# Patient Record
Sex: Female | Born: 1960 | Race: White | Hispanic: No | Marital: Married | Smoking: Never smoker
Health system: Southern US, Community
[De-identification: ages and names within clinical notes are randomized; demographics above are authoritative.]

## PROBLEM LIST (undated history)

## (undated) DIAGNOSIS — Z8619 Personal history of other infectious and parasitic diseases: Secondary | ICD-10-CM

## (undated) DIAGNOSIS — K921 Melena: Secondary | ICD-10-CM

## (undated) DIAGNOSIS — N390 Urinary tract infection, site not specified: Secondary | ICD-10-CM

## (undated) DIAGNOSIS — K635 Polyp of colon: Secondary | ICD-10-CM

## (undated) HISTORY — DX: Melena: K92.1

## (undated) HISTORY — DX: Polyp of colon: K63.5

## (undated) HISTORY — DX: Urinary tract infection, site not specified: N39.0

## (undated) HISTORY — DX: Personal history of other infectious and parasitic diseases: Z86.19

---

## 1981-06-11 HISTORY — PX: OTHER SURGICAL HISTORY: SHX169

## 1990-06-11 HISTORY — PX: DILATION AND CURETTAGE OF UTERUS: SHX78

## 2012-04-06 ENCOUNTER — Encounter (HOSPITAL_BASED_OUTPATIENT_CLINIC_OR_DEPARTMENT_OTHER): Payer: Self-pay

## 2012-04-06 ENCOUNTER — Emergency Department (HOSPITAL_BASED_OUTPATIENT_CLINIC_OR_DEPARTMENT_OTHER)
Admission: EM | Admit: 2012-04-06 | Discharge: 2012-04-07 | Disposition: A | Discharge status: Home / Self Care | Payer: Self-pay | Attending: Emergency Medicine | Admitting: Emergency Medicine

## 2012-04-06 ENCOUNTER — Emergency Department (HOSPITAL_BASED_OUTPATIENT_CLINIC_OR_DEPARTMENT_OTHER): Payer: Self-pay

## 2012-04-06 DIAGNOSIS — M25569 Pain in unspecified knee: Secondary | ICD-10-CM | POA: Insufficient documentation

## 2012-04-06 DIAGNOSIS — M25561 Pain in right knee: Secondary | ICD-10-CM

## 2012-04-06 DIAGNOSIS — Y9301 Activity, walking, marching and hiking: Secondary | ICD-10-CM | POA: Insufficient documentation

## 2012-04-06 DIAGNOSIS — S76119A Strain of unspecified quadriceps muscle, fascia and tendon, initial encounter: Secondary | ICD-10-CM

## 2012-04-06 DIAGNOSIS — Y9289 Other specified places as the place of occurrence of the external cause: Secondary | ICD-10-CM | POA: Insufficient documentation

## 2012-04-06 DIAGNOSIS — S838X9A Sprain of other specified parts of unspecified knee, initial encounter: Secondary | ICD-10-CM | POA: Insufficient documentation

## 2012-04-06 DIAGNOSIS — W010XXA Fall on same level from slipping, tripping and stumbling without subsequent striking against object, initial encounter: Secondary | ICD-10-CM | POA: Insufficient documentation

## 2012-04-06 MED ORDER — HYDROCODONE-ACETAMINOPHEN 5-325 MG PO TABS: 1.0000 | ORAL_TABLET | Freq: Four times a day (QID) | ORAL | Status: DC | PRN

## 2012-04-06 MED ORDER — HYDROCODONE-ACETAMINOPHEN 5-325 MG PO TABS
1.0000 | ORAL_TABLET | Freq: Once | ORAL | Status: AC
  Administered 2012-04-06: 1 via ORAL
  Filled 2012-04-06: qty 1

## 2012-04-06 NOTE — ED Provider Notes (Signed)
History   This chart was scribed for Beth Seamen, MD by Beth Schwartz. The patient was seen in room MH05/MH05 and the patient's care was started at 10:54PM.     CSN: [id number]  Arrival date & time 04/06/12  2057   First MD Initiated Contact with Patient 04/06/12 2254      Chief Complaint  Patient presents with  . Fall    (Consider location/radiation/quality/duration/timing/severity/associated sxs/prior treatment) Patient is a 51 y.o. female presenting with fall. The history is provided by the patient. No language interpreter was used.  Fall The accident occurred yesterday. The fall occurred while walking. She fell from an unknown height. She landed on a hard floor. There was no blood loss. The point of impact was the right knee. The pain is present in the right knee. She was ambulatory at the scene. There was no entrapment after the fall. There was no drug use involved in the accident. Pertinent negatives include no loss of consciousness. The symptoms are aggravated by standing, use of the injured limb and ambulation. She has tried ice for the symptoms. The treatment provided no relief.    Beth Schwartz is a 51 y.o. female who presents to the Emergency Department complaining of   sudden, progressively worsening knee pain located at the right knee, onset yesterday (04/05/12)  Associated symptoms include neck pain and sweats.  The pt reports that she fell yesterday morning while on a ladies retreat with her church group. The pt was en route to the bathroom, where suddenly lost her footing, and impacted upon her right side. The pt has been taking ibuprofen which does not provide relief of the knee pain and applying ice to the right knee which does not provide pain relief. Modifying factors include walking and certain movements and positions of the right knee which intensify the knee pain.  The pt denies any LOC associated with the fall episode.    The pt does not smoke or drink alcohol.       History reviewed. No pertinent past medical history.  History reviewed. No pertinent past surgical history.  No family history on file.  History  Substance Use Topics  . Smoking status: Never Smoker   . Smokeless tobacco: Not on file  . Alcohol Use:     OB History    Grav Para Term Preterm Abortions TAB SAB Ect Mult Living                  Review of Systems  Neurological: Negative for loss of consciousness.  All other systems reviewed and are negative.    Allergies  Review of patient's allergies indicates no known allergies.  Home Medications   Current Outpatient Rx  Name Route Sig Dispense Refill  . IBUPROFEN 200 MG PO TABS Oral Take 200 mg by mouth every 6 (six) hours as needed.      BP 102/74  Pulse 84  Temp 98 F (36.7 C) (Oral)  Resp 16  SpO2 100%  Physical Exam  Nursing note and vitals reviewed. Constitutional: She is oriented to person, place, and time. She appears well-developed and well-nourished.  HENT:  Head: Atraumatic.  Nose: Nose normal.       Ecchymosis at right corner of mouth.   Eyes: Conjunctivae normal and EOM are normal. Pupils are equal, round, and reactive to light.  Neck: Normal range of motion. Neck supple.  Cardiovascular: Normal rate, regular rhythm and normal heart sounds.   Pulmonary/Chest: Effort normal and breath  sounds normal.  Abdominal: Soft. Bowel sounds are normal.  Musculoskeletal: Normal range of motion. She exhibits tenderness.       Generally tender with insertion of the right quadriceps.   Neurological: She is alert and oriented to person, place, and time.  Skin: Skin is warm and dry.  Psychiatric: She has a normal mood and affect. Her behavior is normal.    ED Course  Procedures (including critical care time)  DIAGNOSTIC STUDIES: Oxygen Saturation is 100% on room air, normal by my interpretation.    COORDINATION OF CARE:  11:01 PM- Treatment plan concerning x-ray of right knee and pain management  discussed with patient. Pt agrees with treatment.      MDM  Nursing notes and vitals signs, including pulse oximetry, reviewed.  Summary of this visit's results, reviewed by myself:  Imaging Studies: Dg Knee Complete 4 Views Right  04/06/2012  *RADIOLOGY REPORT*  Clinical Data: Fall 1 day ago.  Pain.  RIGHT KNEE - COMPLETE 4+ VIEW  Comparison: None.  Findings: There is no acute bony or joint abnormality.  There is some degenerative change about the knee most notable in the patellofemoral compartment.  There is no joint effusion.  IMPRESSION:  1.  No acute finding. 2.  Patellofemoral degenerative change.   Original Report Authenticated By: Beth Schwartz. Beth Schwartz, M.D.          I personally performed the services described in this documentation, which was scribed in my presence.  The recorded information has been reviewed and considered.     Beth Seamen, MD 04/06/12 [phone]

## 2012-04-06 NOTE — ED Notes (Signed)
Patient here after reporting that she stood up to go to bathroom and her legs gave way, she fell to ground landing on right side. Reports that her pain is worse to right knee and pain with any ambulation-iced and used ibuprofen w/o relief. Also complains of right sided facial pain and right breast pain, no loc

## 2012-04-06 NOTE — ED Notes (Signed)
MD at bedside. 

## 2012-04-06 NOTE — ED Notes (Signed)
Returned from xray

## 2012-04-06 NOTE — ED Notes (Signed)
Transported to xray 

## 2012-04-07 MED ORDER — ONDANSETRON 4 MG PO TBDP
4.0000 mg | ORAL_TABLET | Freq: Once | ORAL | Status: AC
  Administered 2012-04-07: 4 mg via ORAL
  Filled 2012-04-07: qty 1

## 2012-04-07 NOTE — ED Notes (Signed)
At d/c home pt. States she feels nauseated and like she is going to "pass out" MD updated and pt. Medicated per order. Vitals stable. Cool cloth applied to her head. Will monitor.

## 2012-04-07 NOTE — ED Notes (Signed)
Pt. States she feels "a little" better. Ginger Ale and crackers given. VSS.

## 2012-04-07 NOTE — ED Notes (Signed)
Patient stated she was feeling sick as I helped her into wheel chair. Patient was not able to help get herself into chair, her husband and I assisted her into chair. Patient stated she felt like falling out. I called nurse who brought patient nausea med. Patient lethargic slouching in wheel chair. Husband at her side.

## 2012-04-07 NOTE — ED Notes (Signed)
VSS remain stable. Pt. States she feels better. Pt. Assisted to car by Mauricio Po, EMT

## 2012-04-07 NOTE — ED Notes (Signed)
I applied knee immobilizer, then helped patient with crutches. Patient felt unable to use crutches so refused to take home. Patient stated she will use her walker once home.

## 2012-04-07 NOTE — ED Notes (Signed)
EMT at bedside to place knee immobilizer 

## 2014-08-04 ENCOUNTER — Ambulatory Visit: Payer: Self-pay | Admitting: Physician Assistant

## 2014-08-04 ENCOUNTER — Ambulatory Visit (INDEPENDENT_AMBULATORY_CARE_PROVIDER_SITE_OTHER): Payer: BLUE CROSS/BLUE SHIELD | Admitting: Physician Assistant

## 2014-08-04 ENCOUNTER — Encounter: Payer: Self-pay | Admitting: Physician Assistant

## 2014-08-04 VITALS — BP 135/71 | HR 69 | Temp 98.2°F | Resp 16 | Ht 61.0 in | Wt 232.2 lb

## 2014-08-04 DIAGNOSIS — K429 Umbilical hernia without obstruction or gangrene: Secondary | ICD-10-CM

## 2014-08-04 DIAGNOSIS — M2560 Stiffness of unspecified joint, not elsewhere classified: Secondary | ICD-10-CM

## 2014-08-04 DIAGNOSIS — I872 Venous insufficiency (chronic) (peripheral): Secondary | ICD-10-CM

## 2014-08-04 LAB — CBC
HCT: 40.4 % (ref 36.0–46.0)
Hemoglobin: 13.5 g/dL (ref 12.0–15.0)
MCHC: 33.4 g/dL (ref 30.0–36.0)
MCV: 84.1 fl (ref 78.0–100.0)
Platelets: 215 10*3/uL (ref 150.0–400.0)
RBC: 4.81 Mil/uL (ref 3.87–5.11)
RDW: 13.1 % (ref 11.5–15.5)
WBC: 9.2 10*3/uL (ref 4.0–10.5)

## 2014-08-04 LAB — C-REACTIVE PROTEIN: CRP: 2.2 mg/dL (ref 0.5–20.0)

## 2014-08-04 MED ORDER — MELOXICAM 15 MG PO TABS: 15.0000 mg | ORAL_TABLET | Freq: Every day | ORAL | Status: DC

## 2014-08-04 NOTE — Patient Instructions (Signed)
Please go to the lab for blood work.  You will be called to schedule an [place] of your legs.  I will call you with your results.  Stay active.  Take Mobic as directed if pain is moderate.  Ibuprofen or extra-strength tylenol for mild pain.  For the hernia, stay well hydrated.  You will be contacted to schedule an appointment with general surgery.  If you notice severe pain at the hernia site, redness, tenderness, inability to pass gas or stool, or fever, please go to the ER.  Please return at your earliest convenience for a complete physical.

## 2014-08-04 NOTE — Progress Notes (Signed)
Pre visit review using our clinic review tool, if applicable. No additional management support is needed unless otherwise documented below in the visit note/SLS  
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
## 2014-08-05 LAB — RHEUMATOID FACTOR

## 2014-08-09 DIAGNOSIS — I872 Venous insufficiency (chronic) (peripheral): Secondary | ICD-10-CM | POA: Insufficient documentation

## 2014-08-09 DIAGNOSIS — K429 Umbilical hernia without obstruction or gangrene: Secondary | ICD-10-CM | POA: Insufficient documentation

## 2014-08-09 DIAGNOSIS — M2560 Stiffness of unspecified joint, not elsewhere classified: Secondary | ICD-10-CM | POA: Insufficient documentation

## 2014-08-09 NOTE — Assessment & Plan Note (Signed)
Unclear etiology.  Will obtain labs to further assess - CBC, hs-CRP, RF.

## 2014-08-09 NOTE — Assessment & Plan Note (Signed)
Intermittent pain.  Palpable on examination.  Will refer to General Surgery for further evaluation.

## 2014-08-09 NOTE — Assessment & Plan Note (Signed)
Will obtain doppler venous reflux to further assess.  Increased ambulation.  Limit salt. Compression stockings.

## 2014-08-11 ENCOUNTER — Inpatient Hospital Stay (HOSPITAL_COMMUNITY)
Admission: RE | Admit: 2014-08-11 | Discharge: 2014-08-11 | Disposition: A | Discharge status: Home / Self Care | Payer: BLUE CROSS/BLUE SHIELD | Source: Ambulatory Visit

## 2014-08-11 DIAGNOSIS — I872 Venous insufficiency (chronic) (peripheral): Secondary | ICD-10-CM

## 2014-08-12 ENCOUNTER — Ambulatory Visit (HOSPITAL_COMMUNITY)
Admission: RE | Admit: 2014-08-12 | Discharge: 2014-08-12 | Disposition: A | Discharge status: Home / Self Care | Payer: BLUE CROSS/BLUE SHIELD | Source: Ambulatory Visit | Attending: Vascular Surgery | Admitting: Vascular Surgery

## 2014-08-12 ENCOUNTER — Other Ambulatory Visit: Payer: Self-pay | Admitting: Physician Assistant

## 2014-08-12 DIAGNOSIS — I872 Venous insufficiency (chronic) (peripheral): Secondary | ICD-10-CM

## 2014-08-31 ENCOUNTER — Other Ambulatory Visit: Payer: Self-pay | Admitting: Physician Assistant

## 2014-09-01 NOTE — Telephone Encounter (Signed)
Med filled.  

## 2015-04-09 ENCOUNTER — Encounter: Payer: Self-pay | Admitting: Family Medicine

## 2015-04-09 ENCOUNTER — Ambulatory Visit (INDEPENDENT_AMBULATORY_CARE_PROVIDER_SITE_OTHER): Payer: BLUE CROSS/BLUE SHIELD | Admitting: Family Medicine

## 2015-04-09 VITALS — BP 112/80 | HR 74 | Temp 97.7°F | Ht 61.0 in | Wt 229.5 lb

## 2015-04-09 DIAGNOSIS — R05 Cough: Secondary | ICD-10-CM | POA: Diagnosis not present

## 2015-04-09 DIAGNOSIS — R059 Cough, unspecified: Secondary | ICD-10-CM

## 2015-04-09 DIAGNOSIS — J0110 Acute frontal sinusitis, unspecified: Secondary | ICD-10-CM | POA: Diagnosis not present

## 2015-04-09 MED ORDER — AZITHROMYCIN 250 MG PO TABS: ORAL_TABLET | ORAL | Status: AC

## 2015-04-09 MED ORDER — BENZONATATE 200 MG PO CAPS: 200.0000 mg | ORAL_CAPSULE | Freq: Three times a day (TID) | ORAL | Status: AC | PRN
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
## 2015-04-09 NOTE — Progress Notes (Signed)
Pre visit review using our clinic review tool, if applicable. No additional management support is needed unless otherwise documented below in the visit note. 

## 2015-04-09 NOTE — Patient Instructions (Signed)

## 2015-04-11 ENCOUNTER — Ambulatory Visit (INDEPENDENT_AMBULATORY_CARE_PROVIDER_SITE_OTHER): Payer: BLUE CROSS/BLUE SHIELD | Admitting: Physician Assistant

## 2015-04-11 ENCOUNTER — Encounter: Payer: Self-pay | Admitting: Physician Assistant

## 2015-04-11 ENCOUNTER — Ambulatory Visit (HOSPITAL_BASED_OUTPATIENT_CLINIC_OR_DEPARTMENT_OTHER)
Admission: RE | Admit: 2015-04-11 | Discharge: 2015-04-11 | Disposition: A | Discharge status: Home / Self Care | Payer: BLUE CROSS/BLUE SHIELD | Source: Ambulatory Visit | Attending: Physician Assistant | Admitting: Physician Assistant

## 2015-04-11 VITALS — BP 139/83 | HR 64 | Temp 98.3°F | Resp 18 | Ht 61.0 in | Wt 227.4 lb

## 2015-04-11 DIAGNOSIS — B9689 Other specified bacterial agents as the cause of diseases classified elsewhere: Secondary | ICD-10-CM

## 2015-04-11 DIAGNOSIS — J00 Acute nasopharyngitis [common cold]: Secondary | ICD-10-CM

## 2015-04-11 DIAGNOSIS — J208 Acute bronchitis due to other specified organisms: Secondary | ICD-10-CM

## 2015-04-11 DIAGNOSIS — M545 Low back pain, unspecified: Secondary | ICD-10-CM | POA: Insufficient documentation

## 2015-04-11 DIAGNOSIS — R05 Cough: Secondary | ICD-10-CM | POA: Diagnosis present

## 2015-04-11 DIAGNOSIS — R0989 Other specified symptoms and signs involving the circulatory and respiratory systems: Secondary | ICD-10-CM | POA: Insufficient documentation

## 2015-04-11 MED ORDER — HYDROCOD POLST-CPM POLST ER 10-8 MG/5ML PO SUER: 5.0000 mL | Freq: Two times a day (BID) | ORAL | Status: DC | PRN

## 2015-04-11 MED ORDER — METHOCARBAMOL 500 MG PO TABS: 500.0000 mg | ORAL_TABLET | Freq: Three times a day (TID) | ORAL | Status: AC

## 2015-04-11 MED ORDER — METHOCARBAMOL 500 MG PO TABS: 500.0000 mg | ORAL_TABLET | Freq: Three times a day (TID) | ORAL | Status: DC

## 2015-04-11 MED ORDER — HYDROCOD POLST-CPM POLST ER 10-8 MG/5ML PO SUER: 5.0000 mL | Freq: Two times a day (BID) | ORAL | Status: AC | PRN

## 2015-04-11 NOTE — Progress Notes (Signed)
Pre visit review using our clinic review tool, if applicable. No additional management support is needed unless otherwise documented below in the visit note/SLS  

## 2015-04-11 NOTE — Patient Instructions (Signed)
Go downstairs for chest x-ray. I will call with results. Take antibiotic (Azithromycin) as directed.  Increase fluids.  Get plenty of rest. Use Mucinex for congestion. Use Tussionex as directed for cough Take a daily probiotic (I recommend Align or Culturelle, but even Activia Yogurt may be beneficial).  A humidifier placed in the bedroom may offer some relief for a dry, scratchy throat of nasal irritation.  Read information below on acute bronchitis. Please call or return to clinic if symptoms are not improving.  Take the muscle relaxant as directed. Limit heavy lifting. Suspect pain will resolve once cough is calmed down. Alternate Ibuprofen and extra-strength tylenol for pain.  Acute Bronchitis Bronchitis is when the airways that extend from the windpipe into the lungs get red, puffy, and painful (inflamed). Bronchitis often causes thick spit (mucus) to develop. This leads to a cough. A cough is the most common symptom of bronchitis. In acute bronchitis, the condition usually begins suddenly and goes away over time (usually in 2 weeks). Smoking, allergies, and asthma can make bronchitis worse. Repeated episodes of bronchitis may cause more lung problems.  HOME CARE  Rest.  Drink enough fluids to keep your pee (urine) clear or pale yellow (unless you need to limit fluids as told by your doctor).  Only take over-the-counter or prescription medicines as told by your doctor.  Avoid smoking and secondhand smoke. These can make bronchitis worse. If you are a smoker, think about using nicotine gum or skin patches. Quitting smoking will help your lungs heal faster.  Reduce the chance of getting bronchitis again by:  Washing your hands often.  Avoiding people with cold symptoms.  Trying not to touch your hands to your mouth, nose, or eyes.  Follow up with your doctor as told.  GET HELP IF: Your symptoms do not improve after 1 week of treatment. Symptoms include:  Cough.  Fever.  Coughing  up thick spit.  Body aches.  Chest congestion.  Chills.  Shortness of breath.  Sore throat.  GET HELP RIGHT AWAY IF:   You have an increased fever.  You have chills.  You have severe shortness of breath.  You have bloody thick spit (sputum).  You throw up (vomit) often.  You lose too much body fluid (dehydration).  You have a severe headache.  You faint.  MAKE SURE YOU:   Understand these instructions.  Will watch your condition.  Will get help right away if you are not doing well or get worse. Document Released: 11/14/2007 Document Revised: 01/28/2013 Document Reviewed: 11/18/2012 [facility] Patient Information 2015 [place], [place]. This information is not intended to replace advice given to you by your health care provider. Make sure you discuss any questions you have with your health care provider.

## 2015-04-11 NOTE — Assessment & Plan Note (Signed)
Alternate tylenol and Ibuprofen. Rx Robaxin. Limit heavy lifting. Heating pad as directed.

## 2015-04-11 NOTE — Assessment & Plan Note (Signed)
Will obtain CXR to r/o pneumonia giving continued symptoms. Continue Azithromycin. Will change if indicated by CXR result. Rx Tussionex syrup to use at night. Increase fluids. Rest. Humidifier in bedroom.

## 2015-04-11 NOTE — Progress Notes (Signed)
Patient presents to clinic today c/o chest congestion, productive cough ad fatigue x > 1 week. Was seen at Saturday clinic and started on a Z-pack and tessalon perles. Endorses chest congestion is improving but cough is constant and is causing lower back pain. Denies chest pain, SOB, recent travel or sick contact. Denies fever or chills.  Past Medical History  Diagnosis Date  . History of chicken pox   . Blood in stool   . Colon polyps   . UTI (lower urinary tract infection)     Current Outpatient Prescriptions on File Prior to Visit  Medication Sig Dispense Refill  . azithromycin (ZITHROMAX Z-PAK) 250 MG tablet Take 2 tablets (500 mg) on  Day 1,  followed by 1 tablet (250 mg) once daily on Days 2 through 5. 6 each 0  . benzonatate (TESSALON) 200 MG capsule Take 1 capsule (200 mg total) by mouth 3 (three) times daily as needed. 30 capsule 0  . ibuprofen (ADVIL,MOTRIN) 200 MG tablet Take 200 mg by mouth every 6 (six) hours as needed.    . Multiple Vitamins-Minerals (AIR[place]ORNE) CHEW Chew by mouth daily.     No current facility-administered medications on file prior to visit.    No Known Allergies  Family History  Problem Relation Age of Onset  . Adopted: Yes  . Arthritis Mother     Living  . Colon cancer Mother   . Hyperlipidemia Mother   . Hypertension Mother   . Stomach cancer Mother   . Ovarian cancer Maternal Grandmother   . Lung cancer Maternal Grandmother   . Heart disease Maternal Aunt   . Emphysema Maternal Aunt   . Brain cancer Maternal Aunt   . Stroke Brother   . Healthy Sister     x1  . Cancer Daughter   . Asthma Maternal Grandmother   . Heart Problems Daughter     Social History   Social History  . Marital Status: Married    Spouse Name: N/A  . Number of Children: N/A  . Years of Education: N/A   Social History Main Topics  . Smoking status: Never Smoker   . Smokeless tobacco: Never Used  . Alcohol Use: No  . Drug Use: No  . Sexual Activity: Not  Asked   Other Topics Concern  . None   Social History Narrative   Review of Systems - See HPI.  All other ROS are negative.  BP 139/83 mmHg  Pulse 64  Temp(Src) 98.3 F (36.8 C) (Oral)  Resp 18  Ht 5\' 1"  (1.549 m)  Wt 227 lb 6 oz (103.137 kg)  BMI 42.98 kg/m2  SpO2 98%  Physical Exam  Constitutional: She is oriented to person, place, and time and well-developed, well-nourished, and in no distress.  HENT:  Head: Normocephalic and atraumatic.  Right Ear: External ear normal.  Left Ear: External ear normal.  Nose: Nose normal.  Mouth/Throat: Oropharynx is clear and moist. No oropharyngeal exudate.  TM within normal limits bilaterally.  Eyes: Conjunctivae are normal.  Neck: Neck supple.  Cardiovascular: Normal rate, regular rhythm, normal heart sounds and intact distal pulses.   Pulmonary/Chest: Effort normal and breath sounds normal. No respiratory distress. She has no wheezes. She has no rales. She exhibits no tenderness.  Musculoskeletal:       Lumbar back: She exhibits tenderness and spasm. She exhibits no bony tenderness.  Neurological: She is alert and oriented to person, place, and time.  Skin: Skin is warm and dry.  No rash noted.  Vitals reviewed.   No results found for this or any previous visit (from the past 2160 hour(s)).  Assessment/Plan: Right-sided low back pain without sciatica Alternate tylenol and Ibuprofen. Rx Robaxin. Limit heavy lifting. Heating pad as directed.  Acute bacterial bronchitis Will obtain CXR to r/o pneumonia giving continued symptoms. Continue Azithromycin. Will change if indicated by CXR result. Rx Tussionex syrup to use at night. Increase fluids. Rest. Humidifier in bedroom.

## 2015-05-18 ENCOUNTER — Telehealth: Payer: Self-pay | Admitting: *Deleted

## 2015-05-18 NOTE — Telephone Encounter (Signed)
"  This is on [place].  If you can call me back.  Thank you."

## 2015-05-19 ENCOUNTER — Telehealth: Payer: Self-pay | Admitting: Physician Assistant

## 2015-05-19 NOTE — Telephone Encounter (Signed)
LM for pt to call and schedule flu shot or update records. °

## 2015-05-20 ENCOUNTER — Ambulatory Visit: Payer: BLUE CROSS/BLUE SHIELD | Admitting: Podiatry

## 2015-05-24 ENCOUNTER — Encounter: Payer: Self-pay | Admitting: Podiatry

## 2015-05-24 ENCOUNTER — Ambulatory Visit: Payer: BLUE CROSS/BLUE SHIELD

## 2015-05-24 ENCOUNTER — Ambulatory Visit (INDEPENDENT_AMBULATORY_CARE_PROVIDER_SITE_OTHER): Payer: BLUE CROSS/BLUE SHIELD | Admitting: Podiatry

## 2015-05-24 DIAGNOSIS — R52 Pain, unspecified: Secondary | ICD-10-CM

## 2015-05-24 DIAGNOSIS — M7989 Other specified soft tissue disorders: Secondary | ICD-10-CM

## 2015-05-24 DIAGNOSIS — M898X9 Other specified disorders of bone, unspecified site: Secondary | ICD-10-CM

## 2015-05-24 DIAGNOSIS — M799 Soft tissue disorder, unspecified: Secondary | ICD-10-CM | POA: Diagnosis not present

## 2015-05-24 NOTE — Progress Notes (Signed)
Subjective:    Patient ID: Beth Schwartz, female    DOB: 08-May-1961, 54 y.o.   MRN: [id number]  HPI  54 year old female presents the office as concerns of soft tissue masses of the scalp therapy as well as the outside portion of her left ankle with a left side worse than the right. His been ongoing for several months.  She previously has had injections into both the areas which seems to help however the pain does recur the injections only last for short period time. She has tried offloading padding without any relief of symptoms. This time she is requesting surgical intervention to help decrease her pain. No other complaints.   Review of Systems  All other systems reviewed and are negative.      Objective:   Physical Exam General: AAO x3, NAD  Dermatological: Skin is warm, dry and supple bilateral. Nails x 10 are well manicured; remaining integument appears unremarkable at this time. There are no open sores, no preulcerative lesions, no rash or signs of infection present.  Vascular: Dorsalis Pedis artery and Posterior Tibial artery pedal pulses are 2/4 bilateral with immedate capillary fill time. Pedal hair growth present. No varicosities and no lower extremity edema present bilateral. There is no pain with calf compression, swelling, warmth, erythema.   Neruologic: Grossly intact via light touch bilateral. Vibratory intact via tuning fork bilateral. Protective threshold with Semmes Wienstein monofilament intact to all pedal sites bilateral. Patellar and Achilles deep tendon reflexes 2+ bilateral. No Babinski or clonus noted bilateral.   Musculoskeletal:  On the dorsal aspect of bilateral midfoot there is a soft tissue fluid-filled well encapsulated mass on the dorsal aspect of the foot with an underlying bony exostosis present. The slight tenderness to palpation on this area. No overlying skin change or erythema. On the lateral aspect left ankle just inferior to the lateral malleolus  is a soft,  Fatty soft tissue mass. 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 Mild tenderness upon this area. No edema, erythema open sores. No other areas of tenderness bilateral lower extremity is. Muscular strength 5/5 in all groups tested bilateral.  Gait: Unassisted, Nonantalgic.      Assessment & Plan:   54 year old female presents the office of bilateral soft tissue mass left greater than right -X-rays were obtained and reviewed with the patient.  -Treatment options discussed including all alternatives, risks, and complications -Etiology of symptoms were discussed - At this time I did discuss an MRI of the ankle and the left side to further by with the soft tissue mass. Should proceed with this. This was ordered today. - She also at the overhead and schedule surgery and had a stent by the end of the year. She states that if the MRI is completed before the surgery she would like to proceed with surgery with of the MRI. I discussed the soft tissue mass excision as well as dorsal exostectomy.  I discussed with her this is not a guarantee resolution of symptoms and there is a high chance of recurrence. -The incision placement as well as the postoperative course was discussed with the patient. I discussed risks of the surgery which include, but not limited to, infection, bleeding, pain, swelling, need for further surgery, delayed or nonhealing, painful or ugly scar, numbness or sensation changes, over/under correction, recurrence, transfer lesions, further deformity, hardware failure, DVT/PE, loss of toe/foot. Patient understands these risks and wishes to proceed with surgery. The surgical consent was reviewed with the patient all 3  pages were signed. No promises or guarantees were given to the outcome of the procedure. All questions were answered to the best of my ability. Before the surgery the patient was encouraged to call the office if there is any further questions. The surgery will be performed at  the [facility] on an outpatient basis.  Celesta Gentile, DPM

## 2015-05-24 NOTE — Patient Instructions (Signed)

## 2015-05-25 ENCOUNTER — Telehealth: Payer: Self-pay | Admitting: *Deleted

## 2015-05-25 DIAGNOSIS — M7989 Other specified soft tissue disorders: Secondary | ICD-10-CM

## 2015-05-25 NOTE — Telephone Encounter (Addendum)
-----   Message from Trula Slade, DPM sent at 05/24/2015  8:26 PM EST ----- She needs an MRi of the left ankle due to soft tissue mass on lateral ankle. She is also having surgery 12/28 I think and I would like this before if possible. Thanks. Pt is from [place], I left message requesting location she would prefer for MRI.  Left message with receptionist at [facility] to have pt call.  05/26/2015 - Asked [facility] if soft tissue mass needed contrast, she said MRI need to be with and without.  Ordered Creatnine Serum and faxed to [facility].  Called pt's mobile [phone] unable to leave message mailbox is full.  Pt called states call on her cellphone and she would keep it on her.  Called unable to leave message mailbox full.  Called pt's mobile phone unable to leave a message.  Called pt's home phone [phone] and the phone rang 1 minute and no answer or voicemail. Unable to leave message on cellphone. 05/27/2015 - Called pt's cell 3 times consecutively, to get the pt to answer, unable to leave message mailbox is full, pt's home phone rings with no answering device. INFORMED PT MRI ORDERED WITH [facility] AND WOULD BEGIN PRE-CERT.  GAVE PT [facility] [phone] TO SCHEDULE.  [place] [id number], VALID 05/27/2015 TO 06/25/2015.  FAXED TO [facility].

## 2015-05-26 NOTE — Telephone Encounter (Signed)
-----   Message from Trula Slade, DPM sent at 05/24/2015  8:26 PM EST ----- She needs an MRi of the left ankle due to soft tissue mass on lateral ankle. She is also having surgery 12/28 I think and I would like this before if possible. thanks

## 2015-06-03 ENCOUNTER — Ambulatory Visit
Admission: RE | Admit: 2015-06-03 | Discharge: 2015-06-03 | Disposition: A | Discharge status: Home / Self Care | Payer: BLUE CROSS/BLUE SHIELD | Source: Ambulatory Visit | Attending: Podiatry | Admitting: Podiatry

## 2015-06-03 DIAGNOSIS — M7989 Other specified soft tissue disorders: Secondary | ICD-10-CM

## 2015-06-03 MED ORDER — GADOBENATE DIMEGLUMINE 529 MG/ML IV SOLN: 20.0000 mL | Freq: Once | INTRAVENOUS | Status: DC | PRN

## 2015-06-08 DIAGNOSIS — D492 Neoplasm of unspecified behavior of bone, soft tissue, and skin: Secondary | ICD-10-CM | POA: Diagnosis not present

## 2015-06-08 DIAGNOSIS — M2570 Osteophyte, unspecified joint: Secondary | ICD-10-CM | POA: Diagnosis not present

## 2015-06-17 ENCOUNTER — Other Ambulatory Visit: Payer: Self-pay

## 2015-06-20 ENCOUNTER — Encounter: Payer: Self-pay | Admitting: Podiatry

## 2015-06-20 ENCOUNTER — Other Ambulatory Visit: Payer: Self-pay | Admitting: Podiatry

## 2015-06-20 ENCOUNTER — Ambulatory Visit (INDEPENDENT_AMBULATORY_CARE_PROVIDER_SITE_OTHER): Payer: BLUE CROSS/BLUE SHIELD

## 2015-06-20 ENCOUNTER — Ambulatory Visit (INDEPENDENT_AMBULATORY_CARE_PROVIDER_SITE_OTHER): Payer: BLUE CROSS/BLUE SHIELD | Admitting: Podiatry

## 2015-06-20 VITALS — BP 120/72 | HR 65 | Resp 16

## 2015-06-20 DIAGNOSIS — M799 Soft tissue disorder, unspecified: Secondary | ICD-10-CM | POA: Diagnosis not present

## 2015-06-20 DIAGNOSIS — M898X9 Other specified disorders of bone, unspecified site: Secondary | ICD-10-CM

## 2015-06-20 DIAGNOSIS — Z09 Encounter for follow-up examination after completed treatment for conditions other than malignant neoplasm: Secondary | ICD-10-CM

## 2015-06-20 DIAGNOSIS — M7989 Other specified soft tissue disorders: Secondary | ICD-10-CM

## 2015-06-20 MED ORDER — OXYCODONE-ACETAMINOPHEN 5-325 MG PO TABS: 1.0000 | ORAL_TABLET | Freq: Four times a day (QID) | ORAL | Status: AC | PRN

## 2015-06-21 NOTE — Progress Notes (Signed)
Patient ID: Beth Schwartz, female   DOB: 1961/04/04, 55 y.o.   MRN: [id number]  Subjective: Beth Schwartz is a 54 y.o. 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 She has continue with the cam boot. She finished antibiotics. She is taking pain medicine which comes to help. Denies any systemic complaints such as fevers, chills, nausea, vomiting. No calf pain, chest pain, shortness of breath.   Objective: General: No acute distress, AAOx3  DP/PT pulses palpable 2/4, CRT < 3 sec to all digits.  Protective sensation intact. Motor function intact.  Right foot: Incision is well coapted without any evidence of dehiscence and sutures/staples are intact. There is no surrounding erythema, ascending cellulitis, fluctuance, crepitus, malodor, drainage/purulence. There is mild edema around the surgical site. There is mild pain along the surgical site.  No other areas of tenderness to bilateral lower extremities.  No other open lesions or pre-ulcerative lesions.  No pain with calf compression, swelling, warmth, erythema.   Assessment and Plan:  Status post right foot surgery, doing well with no complications   -X-rays were obtained and reviewed with the patient.  -Treatment options discussed including all alternatives, risks, and complications -Antibiotic ointment followed by a dressing. Keep clean, dry, intact. -Continue cam boot. WBAT -Ice/elevation -Pain medication as needed. -Monitor for any clinical signs or symptoms of infection and DVT/PE and directed to call the office immediately should any occur or go to the ER. -Follow-up in 1 week for suture removal or sooner if any problems arise. In the meantime, encouraged to call the office with any questions, concerns, change in symptoms.   Celesta Gentile, DPM

## 2015-06-27 ENCOUNTER — Ambulatory Visit (INDEPENDENT_AMBULATORY_CARE_PROVIDER_SITE_OTHER): Payer: BLUE CROSS/BLUE SHIELD | Admitting: Podiatry

## 2015-06-27 DIAGNOSIS — M799 Soft tissue disorder, unspecified: Secondary | ICD-10-CM

## 2015-06-27 DIAGNOSIS — M7989 Other specified soft tissue disorders: Secondary | ICD-10-CM

## 2015-06-27 DIAGNOSIS — Z09 Encounter for follow-up examination after completed treatment for conditions other than malignant neoplasm: Secondary | ICD-10-CM

## 2015-06-27 DIAGNOSIS — M898X9 Other specified disorders of bone, unspecified site: Secondary | ICD-10-CM

## 2015-06-28 DIAGNOSIS — M7989 Other specified soft tissue disorders: Secondary | ICD-10-CM | POA: Insufficient documentation

## 2015-06-28 DIAGNOSIS — M898X9 Other specified disorders of bone, unspecified site: Secondary | ICD-10-CM | POA: Insufficient documentation

## 2015-06-28 NOTE — Progress Notes (Signed)
Patient ID: Beth Schwartz, female   DOB: [birth year], 55 y.o.   MRN: [id number]  Subjective: Beth Schwartz is a 55 y.o. 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 She presents today for suture removal. . She has continue with the cam boot. Denies any systemic complaints such as fevers, chills, nausea, vomiting. No calf pain, chest pain, shortness of breath.   Objective: General: No acute distress, AAOx3  DP/PT pulses palpable 2/4, CRT < 3 sec to all digits.  Protective sensation intact. Motor function intact.  Right foot: Incision is well coapted without any evidence of dehiscence and sutures/staples are intact. There is no surrounding erythema, ascending cellulitis, fluctuance, crepitus, malodor, drainage/purulence. There is decreased edema around the surgical site. There is decreased pain along the surgical site.  No other areas of tenderness to bilateral lower extremities.  No other open lesions or pre-ulcerative lesions.  No pain with calf compression, swelling, warmth, erythema.   Assessment and Plan:  Status post right foot surgery, doing well with no complications   -Treatment options discussed including all alternatives, risks, and complications -Sutures removed -Antibiotic ointment followed by a dressing. Continue daily  -Continue cam boot bot now but can slowly start to transition to regular shoegear.  -Ice/elevation -Pain medication as needed. -Monitor for any clinical signs or symptoms of infection and DVT/PE and directed to call the office immediately should any occur or go to the ER. -Follow-up as scheduled or sooner if any problems arise. In the meantime, encouraged to call the office with any questions, concerns, change in symptoms.   Celesta Gentile, DPM

## 2015-07-15 ENCOUNTER — Telehealth: Payer: Self-pay | Admitting: *Deleted

## 2015-07-15 NOTE — Telephone Encounter (Signed)
Pt states next week she is to transfer into a regular shoe from the surgical boot while at home, but should she continue to wear the ace and when can she shower.   I told pt she can transfer from the surgical boot to a casual shoe as directed by Dr.Wagoner, by wearing the casual shoe until uncomfortable, then go in the surgical boot until comfortable, then switch to casual shoe back and forth as necessary and it is more gradual, but she will eventually get into the casual shoe to wear to work.  Pt was instructed to continue the surgical boot at work.  I told her she may try to be without the ace wrap, because there aren't too many shoes that will fit over, but if she needed we could get her in to see me and get an anklet, if she found she was having trouble with swelling.  I told pt she could get her foot shower wet, no soaking.  Pt states understanding.

## 2015-07-25 ENCOUNTER — Encounter: Payer: BLUE CROSS/BLUE SHIELD | Admitting: Podiatry

## 2015-07-26 ENCOUNTER — Ambulatory Visit (INDEPENDENT_AMBULATORY_CARE_PROVIDER_SITE_OTHER): Payer: BLUE CROSS/BLUE SHIELD | Admitting: Podiatry

## 2015-07-26 ENCOUNTER — Encounter: Payer: Self-pay | Admitting: Podiatry

## 2015-07-26 ENCOUNTER — Encounter: Payer: Self-pay | Admitting: *Deleted

## 2015-07-26 VITALS — BP 115/70 | HR 59 | Resp 16

## 2015-07-26 DIAGNOSIS — M799 Soft tissue disorder, unspecified: Secondary | ICD-10-CM

## 2015-07-26 DIAGNOSIS — M7989 Other specified soft tissue disorders: Secondary | ICD-10-CM

## 2015-07-26 DIAGNOSIS — M898X9 Other specified disorders of bone, unspecified site: Secondary | ICD-10-CM

## 2015-07-26 DIAGNOSIS — Z09 Encounter for follow-up examination after completed treatment for conditions other than malignant neoplasm: Secondary | ICD-10-CM

## 2015-07-28 NOTE — Progress Notes (Signed)
Patient ID: Beth Schwartz, female   DOB: [birth year], 55 y.o.   MRN: [id number]   Subjective: Beth Schwartz is a 55 y.o. 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 She states that overall she is improving. The pain is decreasing concurrently level IV out of 10 at worst. The area does swell intermittently but again is improving. Denies any redness or warmth. She is back to wearing a regular shoe. Denies any systemic complaints such as fevers, chills, nausea, vomiting. No calf pain, chest pain, shortness of breath.   Objective: General: No acute distress, AAOx3  DP/PT pulses palpable 2/4, CRT < 3 sec to all digits.  Protective sensation intact. Motor function intact.  Right foot: Incision is well coapted without any evidence of dehiscence and eschar has formed.ythema, ascending cellulitis, fluctuance, crepitus, malodor, drainage/purulence. There is decreased, but mild continued,  edema around the surgical site. There is decreased pain along the surgical site.  No other areas of tenderness to bilateral lower extremities.  No other open lesions or pre-ulcerative lesions.  No pain with calf compression, swelling, warmth, erythema.   Assessment and Plan:  Status post right foot surgery, doing well with no complications   -Treatment options discussed including all alternatives, risks, and complications - Coco butter vitamin E cream over the incision daily. -Continue with regular shoe gear. Compression.  -Ice/elevation -Pain medication as needed. -Monitor for any clinical signs or symptoms of infection and DVT/PE and directed to call the office immediately should any occur or go to the ER. -Follow-up as scheduled or sooner if any problems arise. In the meantime, encouraged to call the office with any questions, concerns, change in symptoms.   Celesta Gentile, DPM

## 2015-08-10 ENCOUNTER — Encounter: Payer: BLUE CROSS/BLUE SHIELD | Admitting: Physician Assistant

## 2015-08-10 ENCOUNTER — Telehealth: Payer: Self-pay | Admitting: Physician Assistant

## 2015-08-11 ENCOUNTER — Encounter: Payer: Self-pay | Admitting: Physician Assistant

## 2015-08-11 NOTE — Telephone Encounter (Signed)
No charge for 1st no-show. Can we send a NS letter out reminding patients if they NS again they may be charged?

## 2015-08-11 NOTE — Telephone Encounter (Signed)
Mailing no show letter - waiving fee

## 2015-08-11 NOTE — Telephone Encounter (Signed)
Pt was no show 08/10/15 4:30pm for cpe, pt has not rescheduled, 1st no show, charge or no charge?

## 2016-02-05 IMAGING — MR MR ANKLE*L* WO/W CM
4 of 9 series · 16 of 40 positions shown · IV contrast (multihance)
Comparison: None.

CLINICAL DATA: Soft tissue mass on the dorsum of the left foot.

EXAM:
MRI OF THE LEFT ANKLE WITHOUT AND WITH CONTRAST
TECHNIQUE: Multiplanar, multisequence MR imaging of the ankle was performed
before and after the administration of intravenous contrast.
CONTRAST:  20 cc MultiHance

[Series 3: T1 fat-sat · axial · 3.0mm · 0.33mm/px · z∈[-42,+59]mm · 3 of 27 slices shown]
[im 1/27]
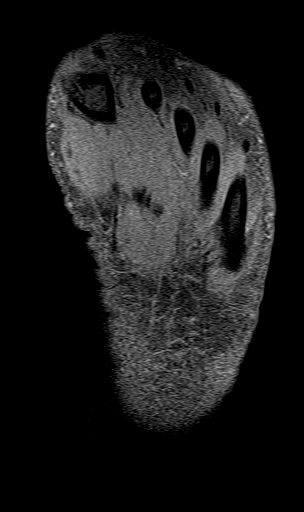
[im 18/27]
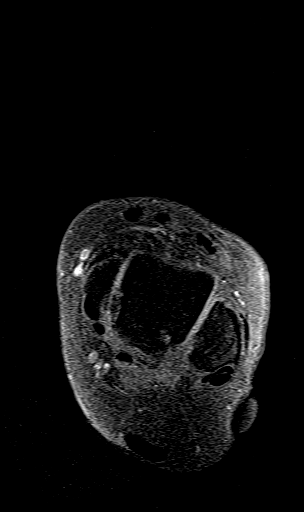
[im 27/27]
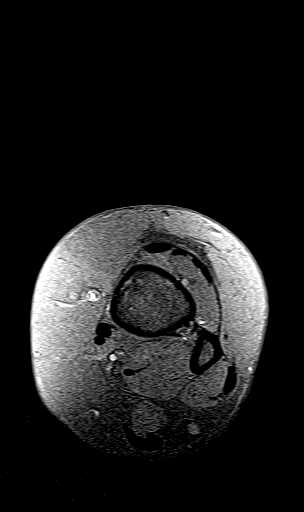

[Series 5: T2 fat-sat · axial · 3.0mm · 0.28mm/px · z∈[-43,+58]mm · 5 of 27 slices shown (1 of 3)]
[im 1/27]
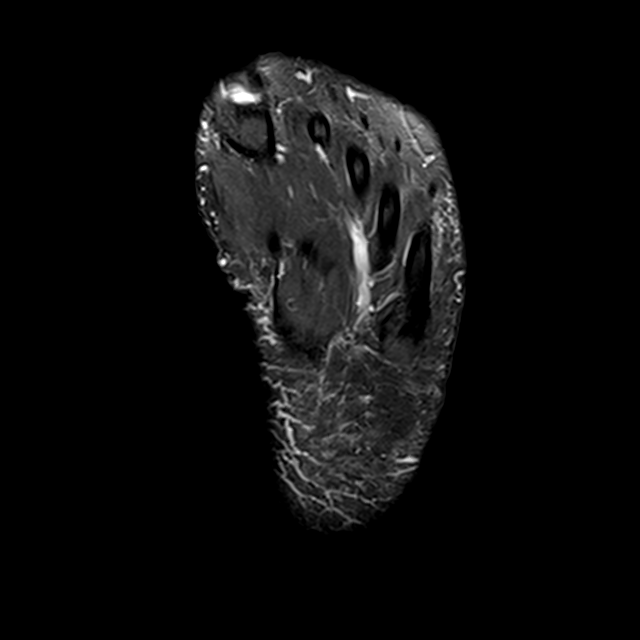
[im 7/27]
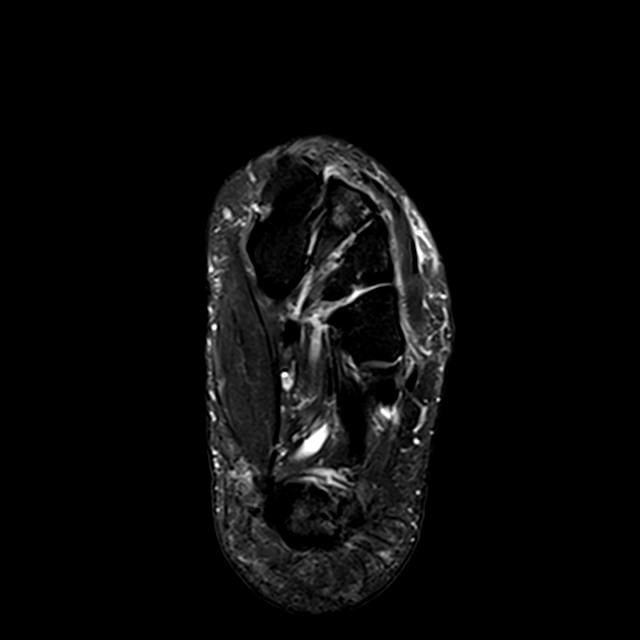
[im 14/27]
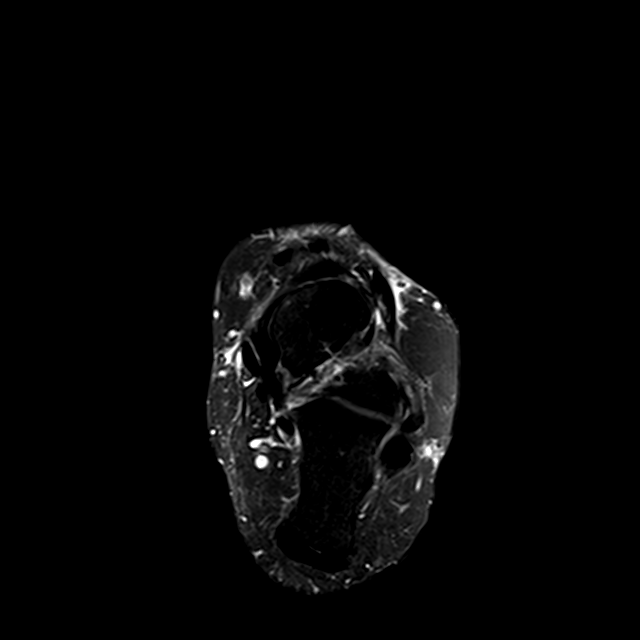
[im 20/27]
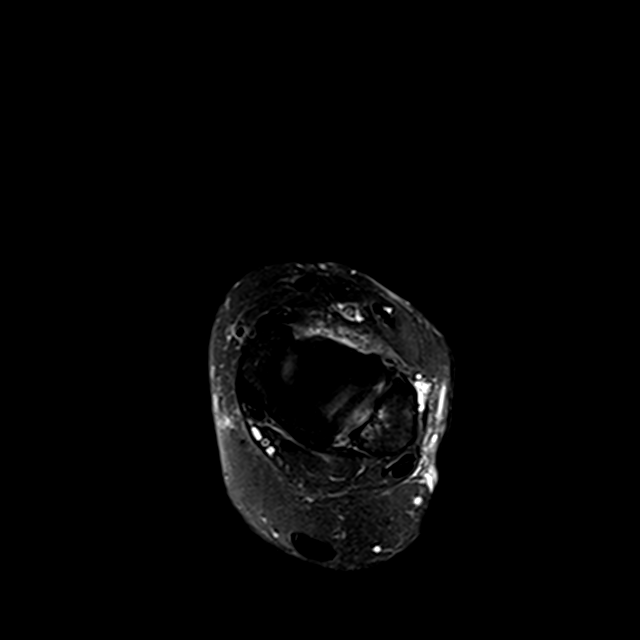
[im 27/27]
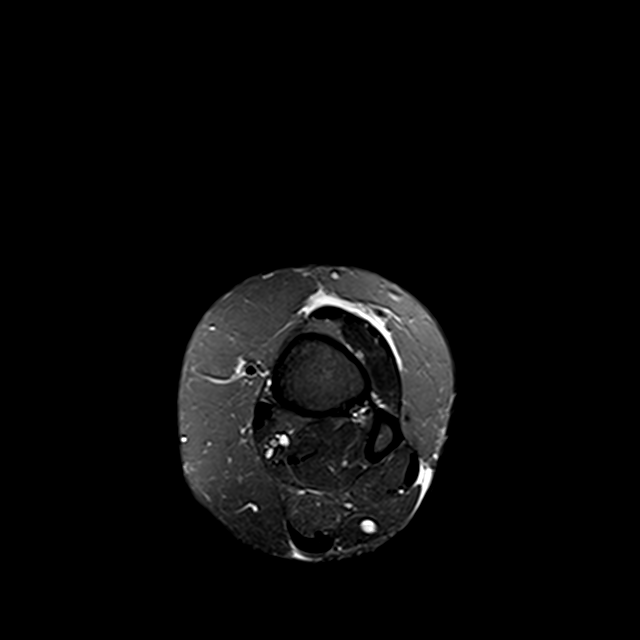

[Series 7: T2 fat-sat · coronal · 4.0mm · 0.33mm/px · 5 of 26 slices shown (2 of 3)]
[im 1/26]
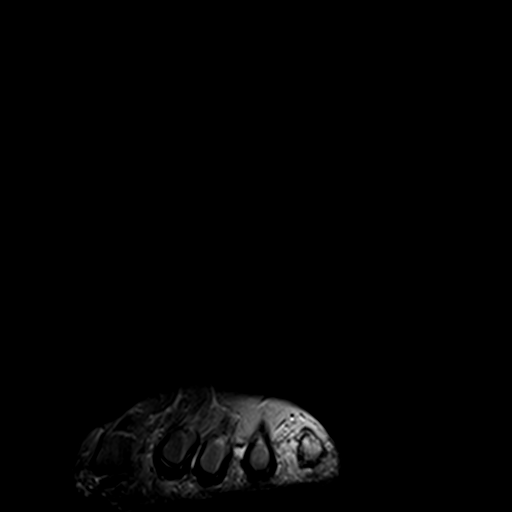
[im 7/26]
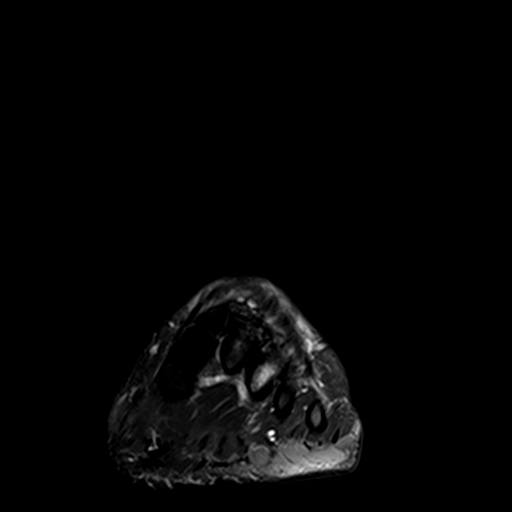
[im 13/26]
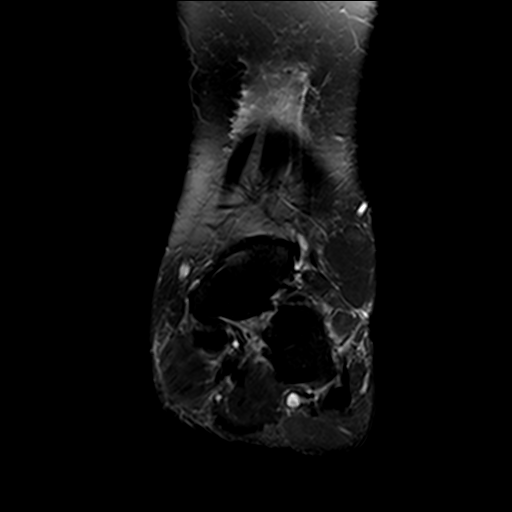
[im 19/26]
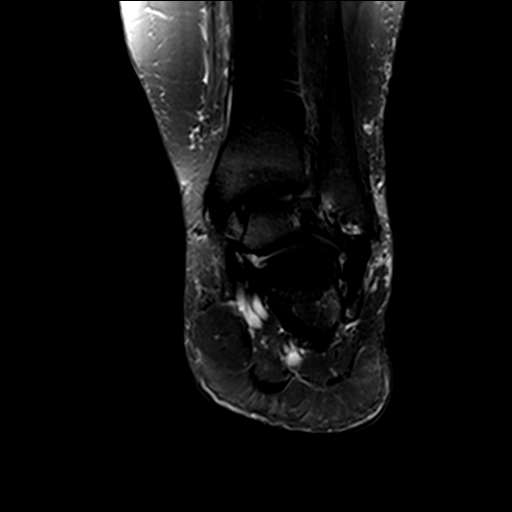
[im 26/26]
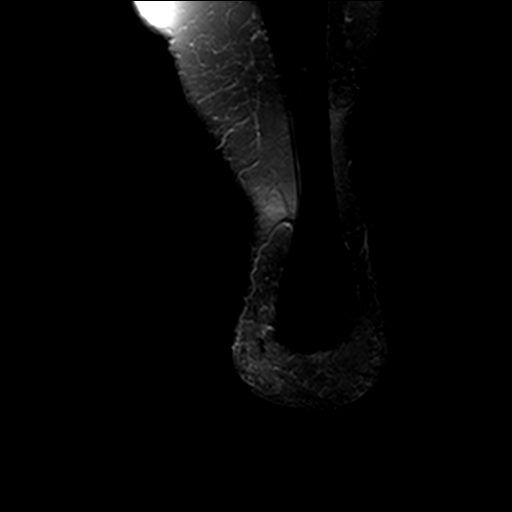

[Series 8: T2 fat-sat · sagittal · 3.0mm · 0.28mm/px · 3 of 24 slices shown (3 of 3)]
[im 1/24]
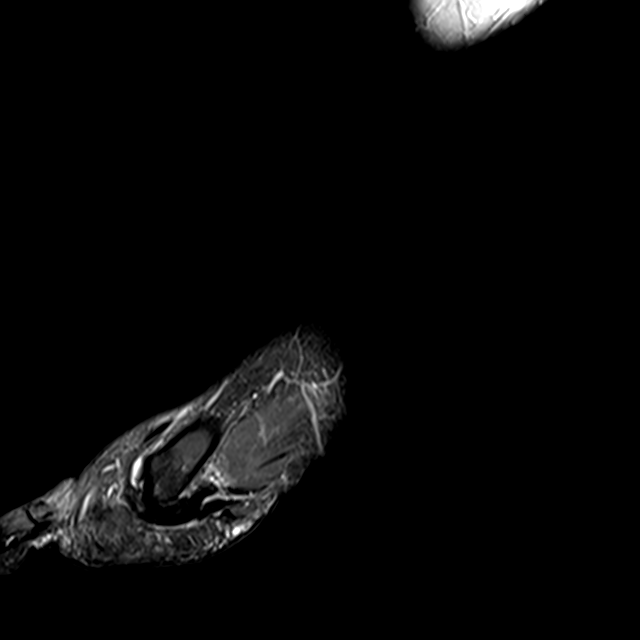
[im 16/24]
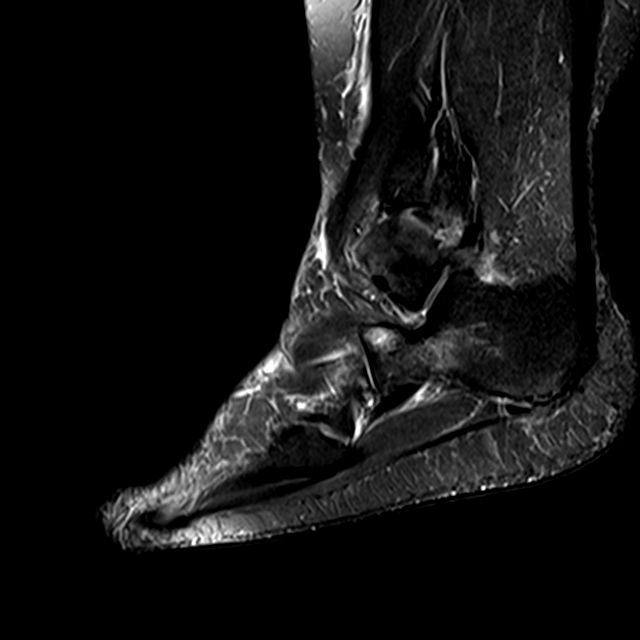
[im 24/24]
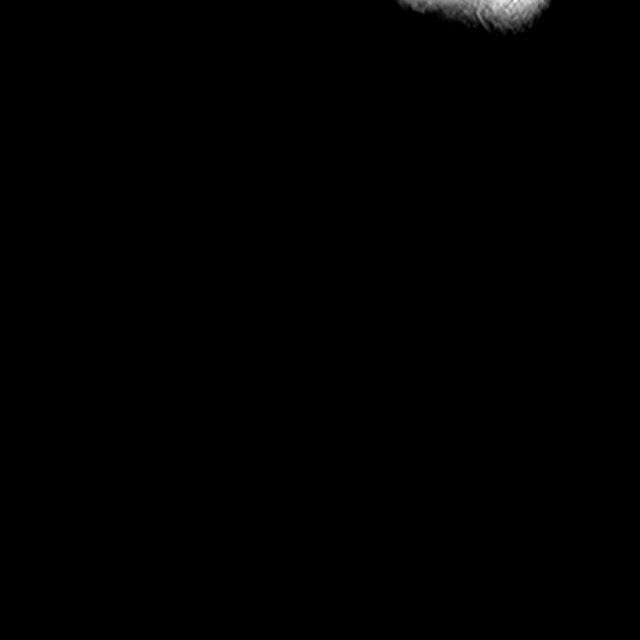

[16 of 40 positions shown; findings below may reference images not displayed]

FINDINGS: TENDONS

Peroneal: Intact.

Posteromedial: Intact.

Anterior: Intact.

Achilles: Intact.  No tendinopathy or tear.

Plantar Fascia: Moderate changes of plantar fasciitis and associated
reactive marrow edema in the calcaneus and small calcaneal spur.

LIGAMENTS

Lateral: Intact.

Medial: Intact.

CARTILAGE

Ankle Joint: No significant degenerative changes or joint effusion.

Subtalar Joints/Sinus Tarsi: The subtalar joints are maintained.
Mild degenerative changes but no joint effusion. The sinus tarsi is
normal.

Moderate midfoot degenerative changes with joint space narrowing,
osteophytic spurring, subchondral cystic change and dorsal spurring.
This likely accounts for the patient's palpable abnormality with
cystic changes and pannus formation. There is also moderate edema in
the region of the anterior process of the calcaneus but I do not see
a discrete fracture. This is likely a stress reaction.

Bones: No fracture or osteochondral abnormality. Remote ununited
distal fibular fractures noted.
IMPRESSION: IMPRESSION
1. Moderate, age advanced, Midfoot degenerative changes with dorsal
spurring, cystic change and pannus likely accounting for the
patient's palpable abnormality. No soft tissue mass or bone lesion.
2. Moderate marrow edema in the anterior process region of the
calcaneus without definite fracture. This may be a stress reaction.
3. Moderate changes of plantar fasciitis.
4. Intact medial and lateral ankle ligaments and tendons.
5. Remote ununited distal fibular fracture.

## 2017-11-21 ENCOUNTER — Encounter: Payer: Self-pay | Admitting: Emergency Medicine
